# Patient Record
Sex: Male | Born: 2005 | Race: Black or African American | Hispanic: No | Marital: Single | Smoking: Current every day smoker
Health system: Southern US, Community
[De-identification: ages and names within clinical notes are randomized; demographics above are authoritative.]

---
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
## 2023-10-08 ENCOUNTER — Emergency Department (HOSPITAL_COMMUNITY)
Admission: EM | Admit: 2023-10-08 | Discharge: 2023-10-09 | Disposition: A | Discharge status: Home / Self Care | Payer: Self-pay | Attending: Emergency Medicine | Admitting: Emergency Medicine

## 2023-10-08 ENCOUNTER — Other Ambulatory Visit: Payer: Self-pay

## 2023-10-08 ENCOUNTER — Encounter (HOSPITAL_COMMUNITY): Payer: Self-pay | Admitting: Emergency Medicine

## 2023-10-08 DIAGNOSIS — K047 Periapical abscess without sinus: Secondary | ICD-10-CM | POA: Insufficient documentation

## 2023-10-08 MED ORDER — AMOXICILLIN 500 MG PO CAPS
1000.0000 mg | ORAL_CAPSULE | Freq: Once | ORAL | Status: AC
  Administered 2023-10-08: 1000 mg via ORAL
  Filled 2023-10-08: qty 2

## 2023-10-08 MED ORDER — NAPROXEN 375 MG PO TABS: 375.0000 mg | ORAL_TABLET | Freq: Two times a day (BID) | ORAL | 0 refills | Status: AC

## 2023-10-08 MED ORDER — AMOXICILLIN 500 MG PO CAPS: 500.0000 mg | ORAL_CAPSULE | Freq: Three times a day (TID) | ORAL | 0 refills | Status: DC

## 2023-10-08 MED ORDER — NAPROXEN 500 MG PO TABS
500.0000 mg | ORAL_TABLET | Freq: Once | ORAL | Status: AC
  Administered 2023-10-08: 500 mg via ORAL
  Filled 2023-10-08: qty 1

## 2023-10-08 NOTE — Discharge Instructions (Signed)
Get help right away if: Your symptoms suddenly get worse. You have a very bad headache. You have problems breathing or swallowing. You have trouble opening your mouth. You have swelling in your neck or around your eye. These symptoms may represent a serious problem that is an emergency. Do not wait to see if the symptoms will go away. Get medical help right away. Call your local emergency services (911 in the [place] Do not drive yourself to the hospital.

## 2023-10-08 NOTE — ED Provider Notes (Signed)
EMERGENCY DEPARTMENT AT [facility] Provider Note   CSN: [id number] Arrival date & time: 10/08/23  2002     History  Chief Complaint  Patient presents with   Mouth Lesions    Walter Stephenson is a 17 y.o. male who presents for evaluation of dental pain.  Patient states that he has pain in the left gum area.  He has had that for the past several days.  He thinks he might have an infection.  He denies trismus ear pain fever or difficulty swallowing   Mouth Lesions      Home Medications Prior to Admission medications   Medication Sig Start Date End Date Taking? Authorizing Provider  acetaminophen (TYLENOL) 325 MG tablet Take 650 mg by mouth every 6 (six) hours as needed. Patient not taking: Reported on 10/25/2022    [provider]  carbamide peroxide (DEBROX) 6.5 % OTIC solution Place 5 drops into the left ear 2 (two) times daily. Patient not taking: Reported on 10/25/2022 05/22/21   Wallis Bamberg, PA-C  ibuprofen (ADVIL) 800 MG tablet Take 1 tablet (800 mg total) by mouth every 8 (eight) hours as needed for up to 21 doses for fever, headache, mild pain or moderate pain. Patient not taking: Reported on 10/25/2022 04/16/22   Theadora Rama Scales, PA-C  metroNIDAZOLE (FLAGYL) 500 MG tablet Take 1 tablet (500 mg total) by mouth 2 (two) times daily. 10/27/22   Georges Mouse, NP      Allergies    Patient has no known allergies.    Review of Systems   Review of Systems  HENT:  Positive for mouth sores.     Physical Exam Updated Vital Signs Wt 71 kg   BMI 22.92 kg/m  Physical Exam Vitals and nursing note reviewed.  Constitutional:      General: He is not in acute distress.    Appearance: He is well-developed. He is not diaphoretic.  HENT:     Head: Normocephalic and atraumatic.     Mouth/Throat:     Comments: Patient has eruption of his third molar on the left lower side.  There is some erythema swelling and induration of the gumline and inner  cheek Eyes:     General: No scleral icterus.    Conjunctiva/sclera: Conjunctivae normal.  Cardiovascular:     Rate and Rhythm: Normal rate and regular rhythm.     Heart sounds: Normal heart sounds.  Pulmonary:     Effort: Pulmonary effort is normal. No respiratory distress.     Breath sounds: Normal breath sounds.  Abdominal:     Palpations: Abdomen is soft.     Tenderness: There is no abdominal tenderness.  Musculoskeletal:     Cervical back: Normal range of motion and neck supple.  Skin:    General: Skin is warm and dry.  Neurological:     Mental Status: He is alert.  Psychiatric:        Behavior: Behavior normal.   .  ED Results / Procedures / Treatments   Labs (all labs ordered are listed, but only abnormal results are displayed) Labs Reviewed - No data to display  EKG None  Radiology No results found.  Procedures Procedures    Medications Ordered in ED Medications - No data to display  ED Course/ Medical Decision Making/ A&P  Medical Decision Making  Patient with toothache.  No gross abscess.  Exam unconcerning for Ludwig's angina or spread of infection.  Will treat with penicillin and pain medicine.  Urged patient to follow-up with dentist.           Final Clinical Impression(s) / ED Diagnoses Final diagnoses:  None    Rx / DC Orders ED Discharge Orders     None         Arthor Captain, PA-C 10/08/23 2140    Bethann Berkshire, MD 10/09/23 1216

## 2023-10-08 NOTE — ED Notes (Signed)
Pt left at this time but was unable to DC patient at that time

## 2023-10-08 NOTE — ED Triage Notes (Signed)
Swelling and pain in right upper molar region. EDP in room at time of triage.

## 2024-07-03 ENCOUNTER — Ambulatory Visit
Admission: RE | Admit: 2024-07-03 | Discharge: 2024-07-03 | Disposition: A | Discharge status: Home / Self Care | Payer: Self-pay | Source: Ambulatory Visit | Attending: Family Medicine | Admitting: Family Medicine

## 2024-07-03 VITALS — BP 119/69 | HR 70 | Temp 98.0°F | Resp 16

## 2024-07-03 DIAGNOSIS — Z113 Encounter for screening for infections with a predominantly sexual mode of transmission: Secondary | ICD-10-CM | POA: Insufficient documentation

## 2024-07-03 LAB — POCT URINALYSIS DIP (MANUAL ENTRY)
Bilirubin, UA: NEGATIVE
Blood, UA: NEGATIVE
Glucose, UA: NEGATIVE mg/dL
Ketones, POC UA: NEGATIVE mg/dL
Leukocytes, UA: NEGATIVE
Nitrite, UA: NEGATIVE
Protein Ur, POC: NEGATIVE mg/dL
Spec Grav, UA: 1.02 (ref 1.010–1.025)
Urobilinogen, UA: 0.2 U/dL
pH, UA: 8 (ref 5.0–8.0)

## 2024-07-03 NOTE — Discharge Instructions (Signed)
 We have sent testing for sexually transmitted infections. We will notify you of any positive results once they are received. If required, we will prescribe any medications you might need.  Please refrain from all sexual activity for at least the next seven days.

## 2024-07-03 NOTE — ED Triage Notes (Signed)
 Pt states he is having pain in lower abdomin and testicle pain x 1 week. Pt would also like to have std testing done and blood work.

## 2024-07-04 LAB — CYTOLOGY, (ORAL, ANAL, URETHRAL) ANCILLARY ONLY
Chlamydia: NEGATIVE
Comment: NEGATIVE
Comment: NEGATIVE
Comment: NORMAL
Neisseria Gonorrhea: NEGATIVE
Trichomonas: NEGATIVE

## 2024-07-04 NOTE — ED Provider Notes (Signed)
 [facility]   [id number] 07/03/24 Arrival Time: 1559  ASSESSMENT & PLAN:  1. Screening for STDs (sexually transmitted diseases)    No empiric tx. No signs of epididymitis or testicular torsion on exam.    Discharge Instructions      We have sent testing for sexually transmitted infections. We will notify you of any positive results once they are received. If required, we will prescribe any medications you might need.  Please refrain from all sexual activity for at least the next seven days.     Pending: Labs Reviewed  POCT URINALYSIS DIP (MANUAL ENTRY)  CYTOLOGY, (ORAL, ANAL, URETHRAL) ANCILLARY ONLY   Results for orders placed or performed during the hospital encounter of 07/03/24  POCT urinalysis dipstick   Collection Time: 07/03/24  4:58 PM  Result Value Ref Range   Color, UA yellow yellow   Clarity, UA clear clear   Glucose, UA negative negative mg/dL   Bilirubin, UA negative negative   Ketones, POC UA negative negative mg/dL   Spec Grav, UA 8.979 8.989 - 1.025   Blood, UA negative negative   pH, UA 8.0 5.0 - 8.0   Protein Ur, POC negative negative mg/dL   Urobilinogen, UA 0.2 0.2 or 1.0 E.U./dL   Nitrite, UA Negative Negative   Leukocytes, UA Negative Negative     Will notify of any positive results. Instructed to refrain from sexual activity for at least seven days.  Reviewed expectations re: course of current medical issues. Questions answered. Outlined signs and symptoms indicating need for more acute intervention. Patient verbalized understanding. After Visit Summary given.   SUBJECTIVE:  Walter Stephenson is a 18 y.o. male who presents with complaint of scrotal soreness; intermittent; x 1 weeks; feels ok now. Denies penile discharge. Desires STI testing.  OBJECTIVE:  Vitals:   07/03/24 1628  BP: 119/69  Pulse: 70  Resp: 16  Temp: 98 F (36.7 C)  TempSrc: Oral  SpO2: 97%     General appearance: alert, cooperative, appears  stated age and no distress Throat: lips, mucosa, and tongue normal; teeth and gums normal Abdomen: soft, non-tender GU: normal appearing genitalia; normal scrotal/testicle exam Skin: warm and dry Psychological: alert and cooperative; normal mood and affect.  Results for orders placed or performed during the hospital encounter of 07/03/24  POCT urinalysis dipstick   Collection Time: 07/03/24  4:58 PM  Result Value Ref Range   Color, UA yellow yellow   Clarity, UA clear clear   Glucose, UA negative negative mg/dL   Bilirubin, UA negative negative   Ketones, POC UA negative negative mg/dL   Spec Grav, UA 8.979 8.989 - 1.025   Blood, UA negative negative   pH, UA 8.0 5.0 - 8.0   Protein Ur, POC negative negative mg/dL   Urobilinogen, UA 0.2 0.2 or 1.0 E.U./dL   Nitrite, UA Negative Negative   Leukocytes, UA Negative Negative    Labs Reviewed  POCT URINALYSIS DIP (MANUAL ENTRY)  CYTOLOGY, (ORAL, ANAL, URETHRAL) ANCILLARY ONLY    No Known Allergies  History reviewed. No pertinent past medical history. History reviewed. No pertinent family history. Social History   Socioeconomic History   Marital status: Single    Spouse name: Not on file   Number of children: Not on file   Years of education: Not on file   Highest education level: Not on file  Occupational History   Not on file  Tobacco Use   Smoking status: Every Day    Types:  Cigarettes   Smokeless tobacco: Never  Substance and Sexual Activity   Alcohol use: Never   Drug use: Never   Sexual activity: Not on file  Other Topics Concern   Not on file  Social History Narrative   Not on file   Social Drivers of Health   Financial Resource Strain: Not on file  Food Insecurity: Not on file  Transportation Needs: Not on file  Physical Activity: Not on file  Stress: Not on file  Social Connections: Not on file  Intimate Partner Violence: Not on file           Rolinda Rogue, MD 07/04/24 [phone]

## 2024-07-13 ENCOUNTER — Ambulatory Visit: Payer: Self-pay

## 2024-07-15 ENCOUNTER — Ambulatory Visit: Payer: Self-pay

## 2024-07-17 ENCOUNTER — Ambulatory Visit
Admission: EM | Admit: 2024-07-17 | Discharge: 2024-07-17 | Disposition: A | Discharge status: Home / Self Care | Payer: Self-pay | Attending: Physician Assistant | Admitting: Physician Assistant

## 2024-07-17 ENCOUNTER — Encounter: Payer: Self-pay | Admitting: Emergency Medicine

## 2024-07-17 ENCOUNTER — Ambulatory Visit: Payer: Self-pay

## 2024-07-17 DIAGNOSIS — J029 Acute pharyngitis, unspecified: Secondary | ICD-10-CM

## 2024-07-17 DIAGNOSIS — J020 Streptococcal pharyngitis: Secondary | ICD-10-CM

## 2024-07-17 LAB — POCT RAPID STREP A (OFFICE): Rapid Strep A Screen: NEGATIVE

## 2024-07-17 MED ORDER — IBUPROFEN 800 MG PO TABS
800.0000 mg | ORAL_TABLET | Freq: Once | ORAL | Status: AC
  Administered 2024-07-17: 800 mg via ORAL

## 2024-07-17 MED ORDER — AMOXICILLIN 500 MG PO CAPS: 500.0000 mg | ORAL_CAPSULE | Freq: Two times a day (BID) | ORAL | 0 refills | Status: AC

## 2024-07-17 NOTE — ED Triage Notes (Signed)
 Pt presents c/o sore throat x 2 weeks. Pt says when he swallows it feels like sharp pains. Pt denies any additional sxs.

## 2024-07-17 NOTE — ED Provider Notes (Signed)
 [facility]    CSN: [id number] Arrival date & time: 07/17/24  9047      History   Chief Complaint No chief complaint on file.   HPI Walter Stephenson is a 18 y.o. male.   HPI  No past medical history on file.  There are no active problems to display for this patient.   No past surgical history on file.     Home Medications    Prior to Admission medications   Medication Sig Start Date End Date Taking? Authorizing Provider  acetaminophen (TYLENOL) 325 MG tablet Take 650 mg by mouth every 6 (six) hours as needed. Patient not taking: Reported on 10/25/2022    [provider]  amoxicillin  (AMOXIL ) 500 MG capsule Take 1 capsule (500 mg total) by mouth 3 (three) times daily. 10/08/23   Harris, Abigail, PA-C  carbamide peroxide (DEBROX) 6.5 % OTIC solution Place 5 drops into the left ear 2 (two) times daily. Patient not taking: Reported on 10/25/2022 05/22/21   Christopher Savannah, PA-C  ibuprofen  (ADVIL ) 800 MG tablet Take 1 tablet (800 mg total) by mouth every 8 (eight) hours as needed for up to 21 doses for fever, headache, mild pain or moderate pain. Patient not taking: Reported on 10/25/2022 04/16/22   Joesph Shaver Scales, PA-C  metroNIDAZOLE  (FLAGYL ) 500 MG tablet Take 1 tablet (500 mg total) by mouth 2 (two) times daily. 10/27/22   Joshua Bari HERO, NP  naproxen  (NAPROSYN ) 375 MG tablet Take 1 tablet (375 mg total) by mouth 2 (two) times daily with a meal. 10/08/23   Arloa Chroman, PA-C    Family History No family history on file.  Social History Social History   Tobacco Use   Smoking status: Every Day    Types: Cigarettes   Smokeless tobacco: Never  Substance Use Topics   Alcohol use: Never   Drug use: Never     Allergies   Patient has no known allergies.   Review of Systems Review of Systems  Constitutional:  Positive for chills. Negative for fever.  HENT:  Positive for congestion and sore throat. Negative for ear pain.   Eyes:  Negative  for discharge and redness.  Respiratory:  Positive for cough. Negative for shortness of breath.   Gastrointestinal:  Negative for abdominal pain, nausea and vomiting.     Physical Exam Triage Vital Signs ED Triage Vitals  Encounter Vitals Group     BP      Girls Systolic BP Percentile      Girls Diastolic BP Percentile      Boys Systolic BP Percentile      Boys Diastolic BP Percentile      Pulse      Resp      Temp      Temp src      SpO2      Weight      Height      Head Circumference      Peak Flow      Pain Score      Pain Loc      Pain Education      Exclude from Growth Chart    No data found.  Updated Vital Signs There were no vitals taken for this visit.  Visual Acuity Right Eye Distance:   Left Eye Distance:   Bilateral Distance:    Right Eye Near:   Left Eye Near:    Bilateral Near:     Physical Exam Vitals and nursing  note reviewed.  Constitutional:      General: He is not in acute distress.    Appearance: He is well-developed. He is not ill-appearing.  HENT:     Head: Normocephalic and atraumatic.     Right Ear: Tympanic membrane normal.     Left Ear: Tympanic membrane normal.     Nose: Congestion present.     Mouth/Throat:     Mouth: Mucous membranes are moist.     Pharynx: Posterior oropharyngeal erythema present. No oropharyngeal exudate.     Tonsils: 0 on the right. 0 on the left.  Eyes:     Conjunctiva/sclera: Conjunctivae normal.  Cardiovascular:     Rate and Rhythm: Normal rate and regular rhythm.     Heart sounds: Normal heart sounds. No murmur heard. Pulmonary:     Effort: Pulmonary effort is normal. No respiratory distress.     Breath sounds: Normal breath sounds. No wheezing, rhonchi or rales.  Skin:    General: Skin is warm and dry.  Neurological:     Mental Status: He is alert.  Psychiatric:        Mood and Affect: Mood normal.        Behavior: Behavior normal.      UC Treatments / Results  Labs (all labs ordered are  listed, but only abnormal results are displayed) Labs Reviewed - No data to display  EKG   Radiology No results found.  Procedures Procedures (including critical care time)  Medications Ordered in UC Medications - No data to display  Initial Impression / Assessment and Plan / UC Course  I have reviewed the triage vital signs and the nursing notes.  Pertinent labs & imaging results that were available during my care of the patient were reviewed by me and considered in my medical decision making (see chart for details).     *** Final Clinical Impressions(s) / UC Diagnoses   Final diagnoses:  None   Discharge Instructions   None    ED Prescriptions   None    PDMP not reviewed this encounter.
